# Patient Record
Sex: Male | Born: 1990 | Race: White | Hispanic: No | Marital: Single | Smoking: Never smoker
Health system: Southern US, Community
[De-identification: ages and names within clinical notes are randomized; demographics above are authoritative.]

---
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
## 2012-02-06 ENCOUNTER — Emergency Department: Payer: Self-pay | Admitting: Family Medicine

## 2020-06-24 ENCOUNTER — Other Ambulatory Visit: Payer: Self-pay

## 2020-06-24 ENCOUNTER — Encounter: Payer: Self-pay | Admitting: Emergency Medicine

## 2020-06-24 ENCOUNTER — Emergency Department
Admission: EM | Admit: 2020-06-24 | Discharge: 2020-06-24 | Disposition: A | Discharge status: Home / Self Care | Payer: BC Managed Care – PPO | Attending: Emergency Medicine | Admitting: Emergency Medicine

## 2020-06-24 DIAGNOSIS — J181 Lobar pneumonia, unspecified organism: Secondary | ICD-10-CM | POA: Diagnosis not present

## 2020-06-24 DIAGNOSIS — Z20822 Contact with and (suspected) exposure to covid-19: Secondary | ICD-10-CM | POA: Insufficient documentation

## 2020-06-24 DIAGNOSIS — R0602 Shortness of breath: Secondary | ICD-10-CM | POA: Diagnosis present

## 2020-06-24 DIAGNOSIS — J189 Pneumonia, unspecified organism: Secondary | ICD-10-CM

## 2020-06-24 DIAGNOSIS — J111 Influenza due to unidentified influenza virus with other respiratory manifestations: Secondary | ICD-10-CM

## 2020-06-24 LAB — SARS CORONAVIRUS 2 (TAT 6-24 HRS): SARS Coronavirus 2: NEGATIVE

## 2020-06-24 MED ORDER — ONDANSETRON 8 MG PO TBDP
8.0000 mg | ORAL_TABLET | Freq: Once | ORAL | Status: AC
  Administered 2020-06-24: 8 mg via ORAL
  Filled 2020-06-24: qty 1

## 2020-06-24 MED ORDER — ONDANSETRON 4 MG PO TBDP
4.0000 mg | ORAL_TABLET | Freq: Three times a day (TID) | ORAL | 0 refills | Status: AC | PRN
Start: 2020-06-24 — End: ?

## 2020-06-24 MED ORDER — AZITHROMYCIN 250 MG PO TABS: ORAL_TABLET | ORAL | 0 refills | Status: DC

## 2020-06-24 MED ORDER — NAPROXEN 500 MG PO TABS
500.0000 mg | ORAL_TABLET | Freq: Once | ORAL | Status: AC
  Administered 2020-06-24: 500 mg via ORAL
  Filled 2020-06-24: qty 1

## 2020-06-24 MED ORDER — NAPROXEN 500 MG PO TABS: 500.0000 mg | ORAL_TABLET | Freq: Two times a day (BID) | ORAL | 0 refills | Status: AC

## 2020-06-24 NOTE — ED Notes (Signed)
ED Provider at bedside. 

## 2020-06-24 NOTE — ED Triage Notes (Addendum)
First Nurse NOte:  Sent from [facility] for ED evaluation.  Patient c/o SOB and Headaches.  Per AVS from urgent care, patient has a left lower lobe pneumonia.  AAOx3.  Skin warm and dry. NAD

## 2020-06-24 NOTE — ED Provider Notes (Signed)
[facility] Emergency Department Provider Note  ____________________________________________  Time seen: Approximately 2:06 PM  I have reviewed the triage vital signs and the nursing notes.   HISTORY  Chief Complaint Shortness of Breath    HPI Kenneth Hodge is a 30 y.o. male with no significant past medical history who is sent to the ED from [facility] due to shortness of breath.  Patient reports that he has had multiple symptoms for the past 5 days including fatigue, generalized body aches, dyspnea on exertion, bilateral frontal headache, sore throat, shortness of breath, occasional nonproductive cough.  Symptoms have been persistent, waxing and waning, no aggravating or alleviating factors.  He went to [facility] today where a rapid COVID was negative, rapid flu was negative, chest x-ray showed left lower lobe infiltrate.  They sent him to the ED for further evaluation.   Patient reports that he is currently feeling better, headache has significantly improved.  Shortness of breath is improved.  Denies chest pain     History reviewed. No pertinent past medical history.   There are no problems to display for this patient.    History reviewed. No pertinent surgical history.   Prior to Admission medications   Medication Sig Start Date End Date Taking? Authorizing Provider  azithromycin (ZITHROMAX Z-PAK) 250 MG tablet Take 2 tablets (500 mg) on  Day 1,  followed by 1 tablet (250 mg) once daily on Days 2 through 5. 06/24/20  Yes Sharman Cheek, MD  naproxen (NAPROSYN) 500 MG tablet Take 1 tablet (500 mg total) by mouth 2 (two) times daily with a meal. 06/24/20  Yes Sharman Cheek, MD  ondansetron (ZOFRAN ODT) 4 MG disintegrating tablet Take 1 tablet (4 mg total) by mouth every 8 (eight) hours as needed for nausea or vomiting. 06/24/20  Yes Sharman Cheek, MD     Allergies Patient has no known allergies.   History reviewed. No pertinent family  history.  Social History Social History   Tobacco Use  . Smoking status: Never Smoker  . Smokeless tobacco: Never Used  Substance Use Topics  . Alcohol use: Yes  . Drug use: Not Currently    Review of Systems  Constitutional:   No fever positive chills.  ENT:   No sore throat. No rhinorrhea. Cardiovascular:   No chest pain or syncope. Respiratory: Positive shortness of breath and cough. Gastrointestinal:   Negative for abdominal pain, vomiting and diarrhea.  Musculoskeletal:   Negative for focal pain or swelling All other systems reviewed and are negative except as documented above in ROS and HPI.  ____________________________________________   PHYSICAL EXAM:  VITAL SIGNS: ED Triage Vitals  Enc Vitals Group     BP 06/24/20 1224 130/71     Pulse Rate 06/24/20 1224 (!) 107     Resp 06/24/20 1257 (!) 22     Temp 06/24/20 1224 98.6 F (37 C)     Temp Source 06/24/20 1224 Oral     SpO2 06/24/20 1224 97 %     Weight 06/24/20 1239 (!) 375 lb (170.1 kg)     Height 06/24/20 1239 6\' 2"  (1.88 m)     Head Circumference --      Peak Flow --      Pain Score 06/24/20 1239 7     Pain Loc --      Pain Edu? --      Excl. in GC? --     Vital signs reviewed, nursing assessments reviewed.  Constitutional:   Alert and oriented. Non-toxic appearance. Eyes:   Conjunctivae are normal. EOMI. PERRL. ENT      Head:   Normocephalic and atraumatic.      Nose:   Wearing a mask.      Mouth/Throat:   Wearing a mask.      Neck:   No meningismus. Full ROM. Hematological/Lymphatic/Immunilogical:   No cervical lymphadenopathy. Cardiovascular:   RRR. Symmetric bilateral radial and DP pulses.  No murmurs. Cap refill less than 2 seconds. Respiratory:   Normal respiratory effort without tachypnea/retractions. Breath sounds are clear and equal bilaterally. No wheezes/rales/rhonchi.  No shortness of breath or worsening symptoms with ambulation test in treatment room.  Lowest pulse ox with  ambulation is 96% on room air. Gastrointestinal:   Soft and nontender. Non distended. There is no CVA tenderness.  No rebound, rigidity, or guarding. Musculoskeletal:   Normal range of motion in all extremities. No joint effusions.  No lower extremity tenderness.  No edema. Neurologic:   Normal speech and language.  Motor grossly intact. No acute focal neurologic deficits are appreciated.  Skin:    Skin is warm, dry and intact. No rash noted.  No petechiae, purpura, or bullae.  ____________________________________________    LABS (pertinent positives/negatives) (all labs ordered are listed, but only abnormal results are displayed) Labs Reviewed  SARS CORONAVIRUS 2 (TAT 6-24 HRS)   ____________________________________________   EKG  Interpreted by me Sinus tachycardia rate 102.  Normal axis and intervals.  Normal QRS ST segments and T waves.  ____________________________________________    RADIOLOGY  No results found.  ____________________________________________   PROCEDURES Procedures  ____________________________________________    CLINICAL IMPRESSION / ASSESSMENT AND PLAN / ED COURSE  Medications ordered in the ED: Medications  ondansetron (ZOFRAN-ODT) disintegrating tablet 8 mg (8 mg Oral Given 06/24/20 1343)  naproxen (NAPROSYN) tablet 500 mg (500 mg Oral Given 06/24/20 1343)    Pertinent labs & imaging results that were available during my care of the patient were reviewed by me and considered in my medical decision making (see chart for details).  Kenneth Hodge was evaluated in Emergency Department on 06/24/2020 for the symptoms described in the history of present illness. He was evaluated in the context of the global COVID-19 pandemic, which necessitated consideration that the patient might be at risk for infection with the SARS-CoV-2 virus that causes COVID-19. Institutional protocols and algorithms that pertain to the evaluation of patients at risk for  COVID-19 are in a state of rapid change based on information released by regulatory bodies including the CDC and federal and state organizations. These policies and algorithms were followed during the patient's care in the ED.   Patient sent to the ED for evaluation of shortness of breath and tachycardia, chest x-ray performed at urgent care showing left lower lobe infiltrate.  Symptoms are very consistent with influenza-like illness/COVID-19.  His rapid antigen test at urgent care were negative, so we will send a PCR.  I will start him on azithromycin for the infiltrate although I suspect it is due to COVID.  He is not septic.   Considering the patient's symptoms, medical history, and physical examination today, I have low suspicion for ACS, PE, TAD, pneumothorax, carditis, mediastinitis, pneumonia, CHF, or sepsis.  Stable for discharge home with supportive care, naproxen and Zofran.      ____________________________________________   FINAL CLINICAL IMPRESSION(S) / ED DIAGNOSES    Final diagnoses:  Influenza-like illness  Community acquired pneumonia of left lower  lobe of lung     ED Discharge Orders         Ordered    naproxen (NAPROSYN) 500 MG tablet  2 times daily with meals        06/24/20 1406    ondansetron (ZOFRAN ODT) 4 MG disintegrating tablet  Every 8 hours PRN        06/24/20 1406    azithromycin (ZITHROMAX Z-PAK) 250 MG tablet        06/24/20 1407          Portions of this note were generated with dragon dictation software. Dictation errors may occur despite best attempts at proofreading.   Sharman Cheek, MD 06/24/20 1410

## 2020-06-24 NOTE — ED Triage Notes (Signed)
Pt comes into the ED via POV sent by [facility] for [facility].  Pt was seen at [facility] and they sent him over for possible sepsis.  Pt had a negative COVID and flu test.  PT did complete a Chest xray at that time as well which shows possible pneumonia.  Pt currently has even and unlabored respirations.  Pt in NAD.  Pt ambulatory to triage. Pt states he feels better after the tylenol and breathing treatment they gave him at [facility].  Denies any dizziness or CP.

## 2020-09-27 ENCOUNTER — Encounter (HOSPITAL_COMMUNITY): Payer: Self-pay

## 2020-09-27 ENCOUNTER — Ambulatory Visit (HOSPITAL_COMMUNITY)
Admission: EM | Admit: 2020-09-27 | Discharge: 2020-09-27 | Disposition: A | Discharge status: Home / Self Care | Payer: Managed Care, Other (non HMO) | Attending: Medical Oncology | Admitting: Medical Oncology

## 2020-09-27 DIAGNOSIS — R109 Unspecified abdominal pain: Secondary | ICD-10-CM

## 2020-09-27 LAB — POCT URINALYSIS DIPSTICK, ED / UC
Bilirubin Urine: NEGATIVE
Glucose, UA: NEGATIVE mg/dL
Hgb urine dipstick: NEGATIVE
Leukocytes,Ua: NEGATIVE
Nitrite: NEGATIVE
Protein, ur: NEGATIVE mg/dL
Specific Gravity, Urine: >=1.03 (ref 1.005–1.030)
Urobilinogen, UA: 0.2 mg/dL (ref 0.0–1.0)
pH: 5.5 (ref 5.0–8.0)

## 2020-09-27 MED ORDER — VALACYCLOVIR HCL 1 G PO TABS: 1000.0000 mg | ORAL_TABLET | Freq: Three times a day (TID) | ORAL | 0 refills | Status: AC

## 2020-09-27 NOTE — ED Triage Notes (Signed)
Pt presents with left side flank pain since yesterday with no other complaints.

## 2020-09-27 NOTE — ED Provider Notes (Addendum)
[facility]    CSN: [id number] Arrival date & time: 09/27/20  1403      History   Chief Complaint Chief Complaint  Patient presents with  . Flank Pain    HPI Kenneth Hodge is a 30 y.o. male.   HPI   Flank Pain: Pt reports left sided flank pain for the past 2 days.  He does report that he has off-and-on flank pain that he has had for quite some time but the episodes only last a few seconds and then resolve on their own.  This has been more constant in nature.  He denies any known rash has had mild itching of the left side compared to the right side. Also feels burning and tingling in area which is tender to touch. He denies any vomiting, stool changes, urinary frequency or hematuria but he does note that his urine has looked darker and has an odor since about the time that he had his Covid vaccine a few months ago.  He has tried staying hydrated with water for symptoms without any significant improvement.   History reviewed. No pertinent past medical history.  There are no problems to display for this patient.   History reviewed. No pertinent surgical history.     Home Medications    Prior to Admission medications   Medication Sig Start Date End Date Taking? Authorizing Provider  valACYclovir (VALTREX) 1000 MG tablet Take 1 tablet (1,000 mg total) by mouth 3 (three) times daily for 7 days. 09/27/20 10/04/20 Yes Letanya Froh M, PA-C  naproxen (NAPROSYN) 500 MG tablet Take 1 tablet (500 mg total) by mouth 2 (two) times daily with a meal. 06/24/20   Sharman Cheek, MD  ondansetron (ZOFRAN ODT) 4 MG disintegrating tablet Take 1 tablet (4 mg total) by mouth every 8 (eight) hours as needed for nausea or vomiting. 06/24/20   Sharman Cheek, MD    Family History Family History  Family history unknown: Yes    Social History Social History   Tobacco Use  . Smoking status: Never Smoker  . Smokeless tobacco: Never Used  Substance Use Topics  . Alcohol  use: Yes  . Drug use: Not Currently     Allergies   Patient has no known allergies.   Review of Systems Review of Systems   Physical Exam Triage Vital Signs ED Triage Vitals  Enc Vitals Group     BP 09/27/20 1453 134/80     Pulse Rate 09/27/20 1453 75     Resp 09/27/20 1453 20     Temp 09/27/20 1453 98.6 F (37 C)     Temp Source 09/27/20 1453 Oral     SpO2 09/27/20 1453 96 %     Weight --      Height --      Head Circumference --      Peak Flow --      Pain Score 09/27/20 1451 8     Pain Loc --      Pain Edu? --      Excl. in GC? --    No data found.  Updated Vital Signs BP 134/80 (BP Location: Right Arm)   Pulse 75   Temp 98.6 F (37 C) (Oral)   Resp 20   SpO2 96%    Physical Exam Vitals and nursing note reviewed.  Constitutional:      General: He is not in acute distress.    Appearance: Normal appearance. He is obese. He is  not ill-appearing, toxic-appearing or diaphoretic.  Cardiovascular:     Rate and Rhythm: Normal rate and regular rhythm.     Heart sounds: Normal heart sounds.  Pulmonary:     Effort: Pulmonary effort is normal.     Breath sounds: Normal breath sounds.  Abdominal:     General: Abdomen is flat. Bowel sounds are normal. There is no distension.     Palpations: Abdomen is soft. There is no mass.     Tenderness: There is no abdominal tenderness. There is no right CVA tenderness, left CVA tenderness, guarding or rebound.     Hernia: No hernia is present.  Musculoskeletal:     Cervical back: Normal range of motion and neck supple.     Comments: ROM back and neck intact. No midline tenderness of spine  Skin:    General: Skin is warm.     Findings: Rash (one small possibly vessicular lesion of the left flank with excoriations of the left side) present.  Neurological:     Mental Status: He is alert.      UC Treatments / Results  Labs (all labs ordered are listed, but only abnormal results are displayed) Labs Reviewed  POCT  URINALYSIS DIPSTICK, ED / UC - Abnormal; Notable for the following components:      Result Value   Ketones, ur TRACE (*)    All other components within normal limits  URINE CULTURE    EKG   Radiology No results found.  Procedures Procedures (including critical care time)  Medications Ordered in UC Medications - No data to display  Initial Impression / Assessment and Plan / UC Course  I have reviewed the triage vital signs and the nursing notes.  Pertinent labs & imaging results that were available during my care of the patient were reviewed by me and considered in my medical decision making (see chart for details).     New. UA shows trace ketones and elevated specific gravity indicating some mild dehydration. Urine culture pending. No evidence of blood or sign of kidney stone. Does not appear to be MSK but possible. Will treat for potential early shingles with Valtrex given rash and history along with urine findings. Discussed reds flag signs and symptoms.    Final Clinical Impressions(s) / UC Diagnoses   Final diagnoses:  Flank pain   Discharge Instructions   None    ED Prescriptions    Medication Sig Dispense Auth. Provider   valACYclovir (VALTREX) 1000 MG tablet Take 1 tablet (1,000 mg total) by mouth 3 (three) times daily for 7 days. 21 tablet Rushie Chestnut, [place]     PDMP not reviewed this encounter.   Rushie Chestnut, PA-C 09/27/20 1551    Rushie Chestnut, PA-C 09/27/20 [phone]

## 2020-09-28 ENCOUNTER — Emergency Department (HOSPITAL_COMMUNITY)
Admission: EM | Admit: 2020-09-28 | Discharge: 2020-09-29 | Disposition: A | Discharge status: Home / Self Care | Payer: Managed Care, Other (non HMO) | Attending: Emergency Medicine | Admitting: Emergency Medicine

## 2020-09-28 ENCOUNTER — Encounter (HOSPITAL_COMMUNITY): Payer: Self-pay | Admitting: Emergency Medicine

## 2020-09-28 ENCOUNTER — Other Ambulatory Visit: Payer: Self-pay

## 2020-09-28 ENCOUNTER — Emergency Department (HOSPITAL_COMMUNITY): Payer: Managed Care, Other (non HMO)

## 2020-09-28 DIAGNOSIS — R091 Pleurisy: Secondary | ICD-10-CM | POA: Insufficient documentation

## 2020-09-28 DIAGNOSIS — R0781 Pleurodynia: Secondary | ICD-10-CM

## 2020-09-28 DIAGNOSIS — R109 Unspecified abdominal pain: Secondary | ICD-10-CM | POA: Diagnosis present

## 2020-09-28 DIAGNOSIS — R0602 Shortness of breath: Secondary | ICD-10-CM | POA: Diagnosis not present

## 2020-09-28 LAB — CBC WITH DIFFERENTIAL/PLATELET
Abs Immature Granulocytes: 0.03 10*3/uL (ref 0.00–0.07)
Basophils Absolute: 0 10*3/uL (ref 0.0–0.1)
Basophils Relative: 0 %
Eosinophils Absolute: 0.3 10*3/uL (ref 0.0–0.5)
Eosinophils Relative: 3 %
HCT: 47.9 % (ref 39.0–52.0)
Hemoglobin: 15.5 g/dL (ref 13.0–17.0)
Immature Granulocytes: 0 %
Lymphocytes Relative: 34 %
Lymphs Abs: 3.6 10*3/uL (ref 0.7–4.0)
MCH: 28.2 pg (ref 26.0–34.0)
MCHC: 32.4 g/dL (ref 30.0–36.0)
MCV: 87.1 fL (ref 80.0–100.0)
Monocytes Absolute: 0.8 10*3/uL (ref 0.1–1.0)
Monocytes Relative: 8 %
Neutro Abs: 5.9 10*3/uL (ref 1.7–7.7)
Neutrophils Relative %: 55 %
Platelets: 321 10*3/uL (ref 150–400)
RBC: 5.5 MIL/uL (ref 4.22–5.81)
RDW: 12.2 % (ref 11.5–15.5)
WBC: 10.6 10*3/uL — ABNORMAL HIGH (ref 4.0–10.5)
nRBC: 0 % (ref 0.0–0.2)

## 2020-09-28 LAB — COMPREHENSIVE METABOLIC PANEL
ALT: 37 U/L (ref 0–44)
AST: 27 U/L (ref 15–41)
Albumin: 4 g/dL (ref 3.5–5.0)
Alkaline Phosphatase: 77 U/L (ref 38–126)
Anion gap: 8 (ref 5–15)
BUN: 13 mg/dL (ref 6–20)
CO2: 27 mmol/L (ref 22–32)
Calcium: 9.1 mg/dL (ref 8.9–10.3)
Chloride: 103 mmol/L (ref 98–111)
Creatinine, Ser: 1.32 mg/dL — ABNORMAL HIGH (ref 0.61–1.24)
GFR, Estimated: >60 mL/min (ref >60)
Glucose, Bld: 95 mg/dL (ref 70–99)
Potassium: 3.8 mmol/L (ref 3.5–5.1)
Sodium: 138 mmol/L (ref 135–145)
Total Bilirubin: 0.6 mg/dL (ref 0.3–1.2)
Total Protein: 7.3 g/dL (ref 6.5–8.1)

## 2020-09-28 LAB — TROPONIN I (HIGH SENSITIVITY): Troponin I (High Sensitivity): 4 ng/L (ref <18)

## 2020-09-28 LAB — URINALYSIS, ROUTINE W REFLEX MICROSCOPIC
Bilirubin Urine: NEGATIVE
Glucose, UA: NEGATIVE mg/dL
Hgb urine dipstick: NEGATIVE
Ketones, ur: NEGATIVE mg/dL
Leukocytes,Ua: NEGATIVE
Nitrite: NEGATIVE
Protein, ur: NEGATIVE mg/dL
Specific Gravity, Urine: 1.025 (ref 1.005–1.030)
pH: 7 (ref 5.0–8.0)

## 2020-09-28 LAB — URINE CULTURE: Culture: NO GROWTH

## 2020-09-28 LAB — LIPASE, BLOOD: Lipase: 44 U/L (ref 11–51)

## 2020-09-28 NOTE — ED Triage Notes (Signed)
Pt c/o left flank pain that radiates to his back. Pain worse with movement. Reports that it is hard to breathe when pain is severe. Pt seen at [facility] yesterday for same, diagnosed with shingles and started on antivirals.

## 2020-09-28 NOTE — ED Triage Notes (Signed)
Emergency Medicine Provider Triage Evaluation Note  Kenneth Hodge , a 30 y.o. male  was evaluated in triage.  Pt complains of luq left chest and left flank pain that started a few days ago. Reports sob and mild cough as well. Denies nvd, urinary sxs, fever, hemoptysis.   Denies leg pain/swelling, hemoptysis, recent surgery/trauma, recent long travel, hormone use, personal hx of cancer, or hx of DVT/PE. Marland Kitchen  Seen at urgent care yesterday and dx with possible shingles. Started on antivirals with no improvement.   Review of Systems  Positive: Left chest, abd, flank pain, sob, cough Negative: Fever, hemoptysis, nvd, fever  Physical Exam  BP 121/73 (BP Location: Right Arm)   Pulse 68   Temp 98.2 F (36.8 C)   Resp 20   SpO2 98%  Gen:   Awake, no distress   HEENT:  Atraumatic  Resp:  Normal effort  Cardiac:  Normal rate, no chest wall ttp Abd:   Nondistended, nontender  MSK:   Moves extremities without difficulty  Neuro:  Speech clear  Skin: no obvious lesions to the skin and no ttp to the skin  Medical Decision Making  Medically screening exam initiated at 9:54 PM.  Appropriate orders placed.  Kenneth Hodge was informed that the remainder of the evaluation will be completed by another provider, this initial triage assessment does not replace that evaluation, and the importance of remaining in the ED until their evaluation is complete.  Clinical Impression   30 y/o male here for left flank, abd and left chest  Pain  MSE was initiated and I personally evaluated the patient and placed orders (if any) at  9:54 PM on September 28, 2020.  The patient appears stable so that the remainder of the MSE may be completed by another provider.    Karrie Meres, [place] 09/28/20 2154

## 2020-09-29 ENCOUNTER — Emergency Department (HOSPITAL_COMMUNITY): Payer: Managed Care, Other (non HMO)

## 2020-09-29 LAB — TROPONIN I (HIGH SENSITIVITY): Troponin I (High Sensitivity): 3 ng/L (ref <18)

## 2020-09-29 LAB — D-DIMER, QUANTITATIVE: D-Dimer, Quant: 0.87 ug/mL-FEU — ABNORMAL HIGH (ref 0.00–0.50)

## 2020-09-29 MED ORDER — IOHEXOL 350 MG/ML SOLN
75.0000 mL | Freq: Once | INTRAVENOUS | Status: AC | PRN
  Administered 2020-09-29: 75 mL via INTRAVENOUS

## 2020-09-29 NOTE — ED Notes (Signed)
Patient transported back from CT 

## 2020-09-29 NOTE — Discharge Instructions (Signed)
You were seen in the ED today with abdominal and chest pain. Your CT scans shows some loss of blood flow to an area of fatty tissue in the abdomen. This is not dangerous but is painful. Continue Tylenol and/or Motrin as needed for pain. You can apply a warm compress. The pain may last for several more days but should ultimately resolve.

## 2020-09-29 NOTE — ED Notes (Signed)
Patient transported to CT via stretcher in stable condition 

## 2020-09-29 NOTE — ED Provider Notes (Signed)
Emergency Department Provider Note   I have reviewed the triage vital signs and the nursing notes.   HISTORY  Chief Complaint Flank Pain   HPI Kenneth Hodge is a 30 y.o. male presents to the emergency department for evaluation of left flank and lower chest pain.  Symptoms of been ongoing for the past several days.  He initially went to urgent care and was diagnosed with possible developing shingles type pain/rash.  He was started on antivirals.  He states that no additional rash has developed and he is concerned for possible kidney stone.  He is also noting some left lower chest pain which is causing him to feel short of breath.  Pain is worse with coughing or deep breathing.  He is not having gross blood in the urine, dysuria, hesitancy, urgency.  No fevers or chills.  No hemoptysis. No prior history of DVT/PE.  History reviewed. No pertinent past medical history.  There are no problems to display for this patient.   History reviewed. No pertinent surgical history.  Allergies Patient has no known allergies.  Family History  Family history unknown: Yes    Social History Social History   Tobacco Use  . Smoking status: Never Smoker  . Smokeless tobacco: Never Used  Substance Use Topics  . Alcohol use: Yes  . Drug use: Not Currently    Review of Systems  Constitutional: No fever/chills Eyes: No visual changes. ENT: No sore throat. Cardiovascular: Positive chest pain. Respiratory: Positive shortness of breath. Gastrointestinal: Positive left flank/abdominal pain. Mild nausea, no vomiting.  No diarrhea.  No constipation. Genitourinary: Negative for dysuria. Musculoskeletal: Negative for back pain. Skin: Negative for rash. Neurological: Negative for headaches, focal weakness or numbness.  10-point ROS otherwise negative.  ____________________________________________   PHYSICAL EXAM:  VITAL SIGNS: ED Triage Vitals  Enc Vitals Group     BP 09/28/20  2036 121/73     Pulse Rate 09/28/20 2036 68     Resp 09/28/20 2036 20     Temp 09/28/20 2036 98.2 F (36.8 C)     Temp src --      SpO2 09/28/20 2036 98 %   Constitutional: Alert and oriented. Well appearing and in no acute distress. Eyes: Conjunctivae are normal. Head: Atraumatic. Nose: No congestion/rhinnorhea. Mouth/Throat: Mucous membranes are moist.  Neck: No stridor.   Cardiovascular: Normal rate, regular rhythm. Good peripheral circulation. Grossly normal heart sounds.   Respiratory: Normal respiratory effort.  No retractions. Lungs CTAB. Gastrointestinal: Soft and nontender. No distention.  Musculoskeletal: No lower extremity tenderness nor edema. No gross deformities of extremities. Neurologic:  Normal speech and language. No gross focal neurologic deficits are appreciated.  Skin:  Skin is warm, dry and intact. No rash noted.  ____________________________________________   LABS (all labs ordered are listed, but only abnormal results are displayed)  Labs Reviewed  COMPREHENSIVE METABOLIC PANEL - Abnormal; Notable for the following components:      Result Value   Creatinine, Ser 1.32 (*)    All other components within normal limits  CBC WITH DIFFERENTIAL/PLATELET - Abnormal; Notable for the following components:   WBC 10.6 (*)    All other components within normal limits  URINALYSIS, ROUTINE W REFLEX MICROSCOPIC - Abnormal; Notable for the following components:   APPearance CLOUDY (*)    All other components within normal limits  D-DIMER, QUANTITATIVE - Abnormal; Notable for the following components:   D-Dimer, Quant 0.87 (*)    All other components within normal  limits  LIPASE, BLOOD  TROPONIN I (HIGH SENSITIVITY)  TROPONIN I (HIGH SENSITIVITY)   ____________________________________________  EKG   EKG Interpretation  Date/Time:  Monday September 28 2020 21:29:35 EDT Ventricular Rate:  81 PR Interval:  136 QRS Duration: 98 QT Interval:  364 QTC  Calculation: 422 R Axis:   38 Text Interpretation: Normal sinus rhythm Normal ECG Confirmed by Alona Bene [id number]) on 09/28/2020 11:52:48 PM       ____________________________________________  RADIOLOGY  DG Chest 2 View  Result Date: 09/28/2020 CLINICAL DATA:  Chest pain EXAM: CHEST - 2 VIEW COMPARISON:  11/07/2009 FINDINGS: Bibasilar atelectasis. Heart is normal size. No effusions. No acute bony abnormality. IMPRESSION: Bibasilar atelectasis. Electronically Signed   By: Charlett Nose M.D.   On: 09/28/2020 21:45   CT Angio Chest PE W and/or Wo Contrast  Result Date: 09/29/2020 CLINICAL DATA:  Left flank pain radiating to the back, worse with movement. Recent diagnosis of shingles EXAM: CT ANGIOGRAPHY CHEST WITH CONTRAST TECHNIQUE: Multidetector CT imaging of the chest was performed using the standard protocol during bolus administration of intravenous contrast. Multiplanar CT image reconstructions and MIPs were obtained to evaluate the vascular anatomy. CONTRAST:  55mL OMNIPAQUE IOHEXOL 350 MG/ML SOLN COMPARISON:  None. FINDINGS: Cardiovascular: Satisfactory opacification of the pulmonary arteries to the segmental level. No evidence of pulmonary embolism. Borderline heart size. No pericardial effusion. Mediastinum/Nodes: Negative for adenopathy or mass. Lungs/Pleura: Bilateral atelectasis. There is no edema, consolidation, effusion, or pneumothorax. Upper Abdomen: Reported separately Musculoskeletal: Negative Review of the MIP images confirms the above findings. IMPRESSION: 1. Negative for pulmonary embolism. 2. Low volume chest with bilateral atelectasis. Electronically Signed   By: Marnee Spring M.D.   On: 09/29/2020 04:20   CT Renal Stone Study  Result Date: 09/29/2020 CLINICAL DATA:  Left flank pain.  Recent diagnosis of shingles. EXAM: CT ABDOMEN AND PELVIS WITHOUT CONTRAST TECHNIQUE: Multidetector CT imaging of the abdomen and pelvis was performed following the standard protocol without  IV contrast. COMPARISON:  None. FINDINGS: Lower chest:  Atelectasis at the lung bases Hepatobiliary: No focal liver abnormality.No evidence of biliary obstruction or stone. Pancreas: Unremarkable. Spleen: Unremarkable. Adrenals/Urinary Tract: Negative adrenals. No hydronephrosis or stone. Unremarkable bladder. Stomach/Bowel:  No obstruction. No appendicitis. Vascular/Lymphatic: No acute vascular abnormality. No mass or adenopathy. Reproductive:No pathologic findings. Other: No ascites or pneumoperitoneum. Discrete, rounded area of fat stranding in the left upper quadrant, anterior to the spleen which is unremarkable itself. Musculoskeletal: No acute abnormalities. No skin or subcutaneous thickening detected to correlate with history of shingles, although CT is of low sensitivity for this purpose. 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 Electronically Signed   By: Marnee Spring M.D.   On: 09/29/2020 04:22    ____________________________________________   PROCEDURES  Procedure(s) performed:   Procedures  None  ____________________________________________   INITIAL IMPRESSION / ASSESSMENT AND PLAN / ED COURSE  Pertinent labs & imaging results that were available during my care of the patient were reviewed by me and considered in my medical decision making (see chart for details).   Patient presents to the emergency department for evaluation of left flank pain.  In looking at the patient's skin I do not see an obvious zoster rash.  Differential includes kidney stone although UA shows no sign of infection or blood in the urine.  Lab work from triage shows a normal troponin which is ordered with the patient's complaint of some chest discomfort  on the same side as flank symptoms.  Chest x-ray also ordered from triage which is normal.  EKG interpreted by me as above with no acute ischemic findings.  Patient's lower chest pain is somewhat  pleuritic and while MSK etiology seems most likely versus referred pain from the left flank I do plan for a D-dimer.  If negative we will move forward with a CT renal protocol. If positive, patient may ultimately require further risk stratification for PE with CTA. Patient is otherwise low risk by [place].   03:35 PM  D-dimer elevated. Will obtain CTA and CT renal.   CTA without PE or other acute process. No ureteral stone. LUQ fatty infarct noted. No other acute findings. This does correlate with area of pain. Plan for OTC pain medications and warm compress. Plan for PCP follow up. Discussed ED return precautions.  ____________________________________________  FINAL CLINICAL IMPRESSION(S) / ED DIAGNOSES  Final diagnoses:  Pleuritic chest pain  Left flank pain     MEDICATIONS GIVEN DURING THIS VISIT:  Medications  iohexol (OMNIPAQUE) 350 MG/ML injection 75 mL (75 mLs Intravenous Contrast Given 09/29/20 0357)     Note:  This document was prepared using Dragon voice recognition software and may include unintentional dictation errors.  Alona Bene, MD, [facility] Emergency Medicine    Dreden Rivere, Arlyss Repress, MD 09/29/20 [id number]

## 2021-12-15 IMAGING — CT CT ANGIO CHEST
2 of 7 series · 18 of 46 positions shown · IV contrast (APPLIED)
Comparison: None.

CLINICAL DATA: Left flank pain radiating to the back, worse with
movement. Recent diagnosis of shingles

EXAM:
CT ANGIOGRAPHY CHEST WITH CONTRAST
TECHNIQUE: Multidetector CT imaging of the chest was performed using the
standard protocol during bolus administration of intravenous
contrast. Multiplanar CT image reconstructions and MIPs were
obtained to evaluate the vascular anatomy.
CONTRAST:  75mL OMNIPAQUE IOHEXOL 350 MG/ML SOLN

[Series 7: thins · axial · 0.78mm/px · z∈[-248,-10]mm · 15 of 385 slices shown]
[im 22/385  lung]
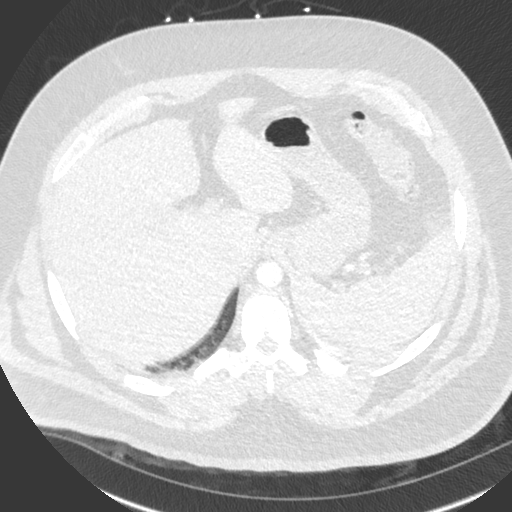
[im 43/385  soft-tissue]
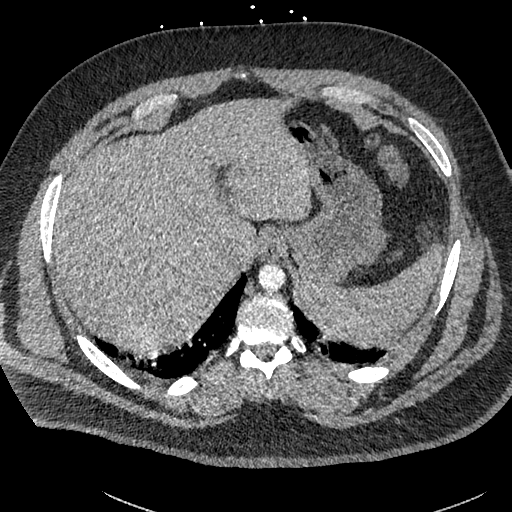
[im 65/385  lung]
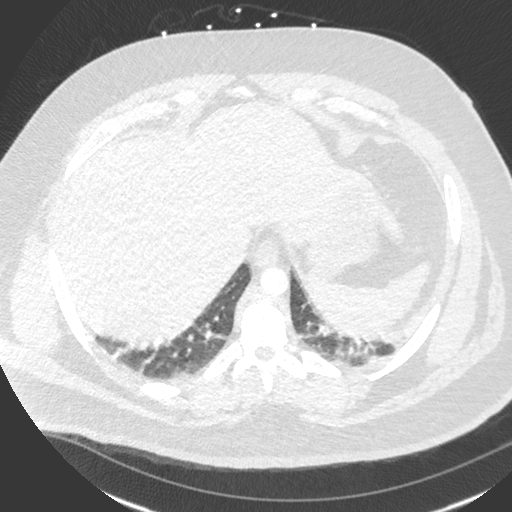
[im 86/385  soft-tissue]
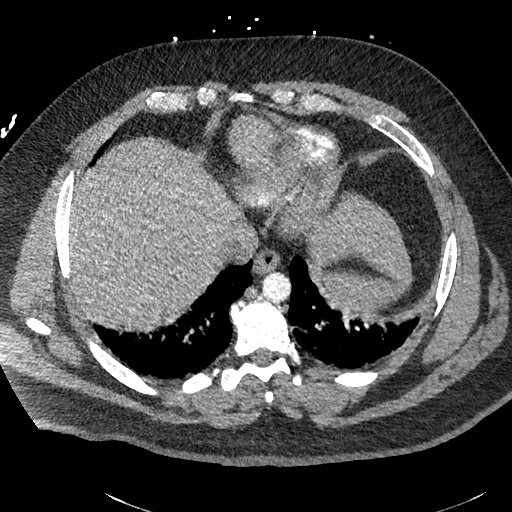
[im 129/385  lung]
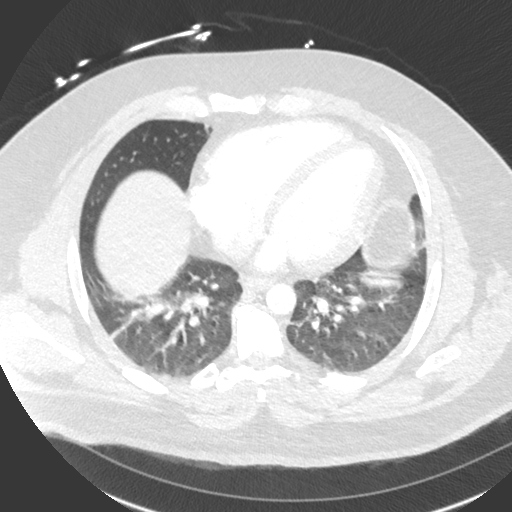
[im 150/385  soft-tissue]
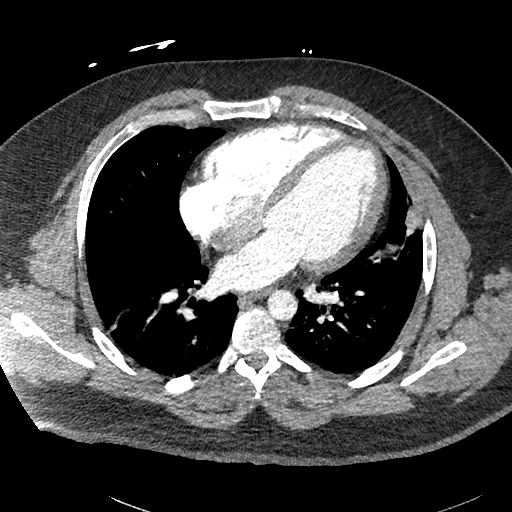
[im 171/385  lung]
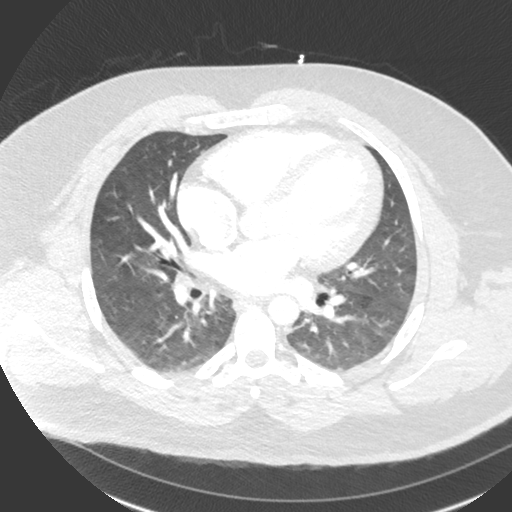
[im 193/385  soft-tissue]
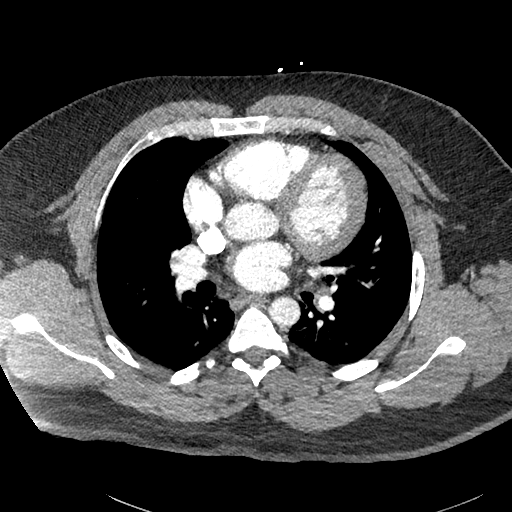
[im 214/385  lung]
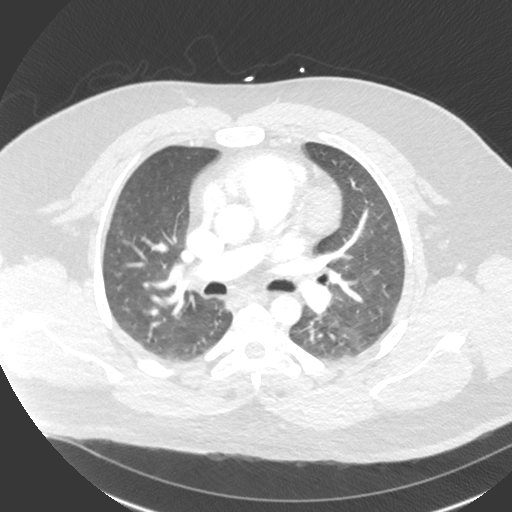
[im 235/385  soft-tissue]
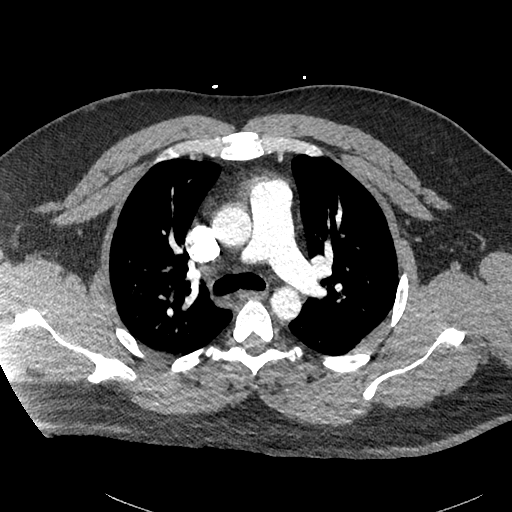
[im 257/385  lung]
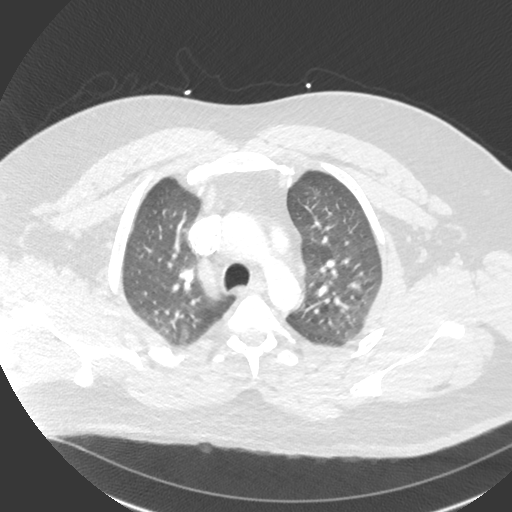
[im 299/385  soft-tissue]
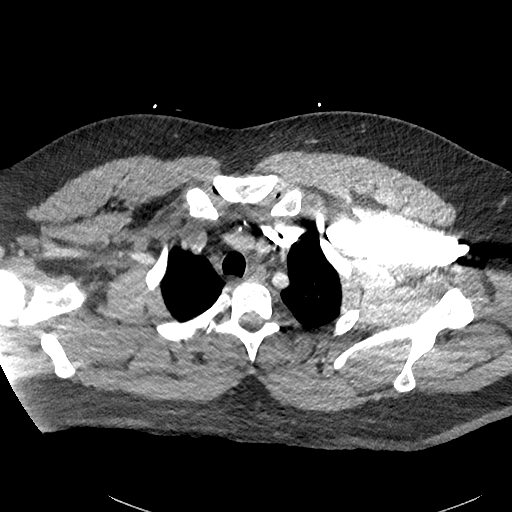
[im 321/385  lung]
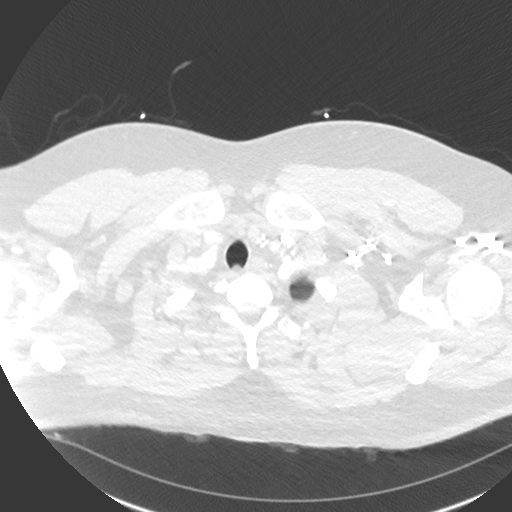
[im 342/385  soft-tissue]
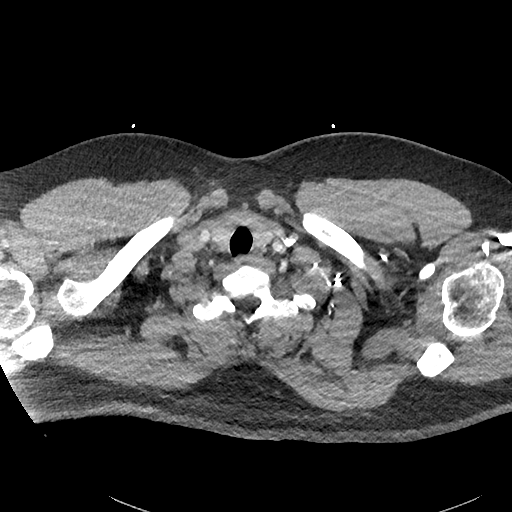
[im 363/385  lung]
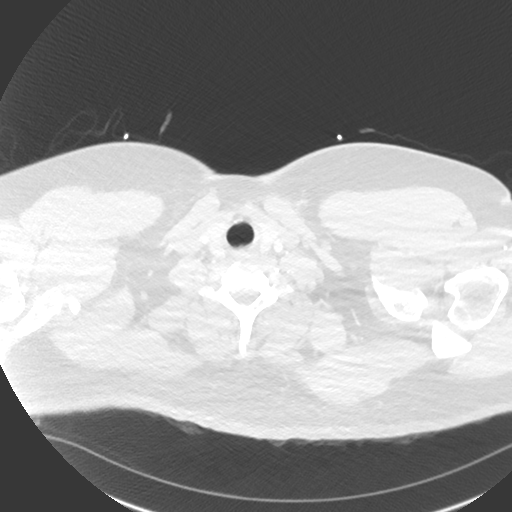

[Series 8: cor · coronal · 0.54mm/px · 3 of 138 slices shown]
[im 35/138  soft-tissue]
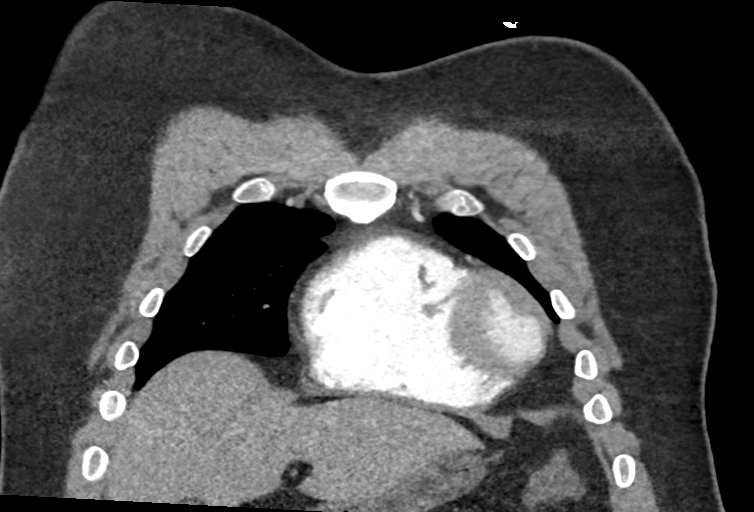
[im 69/138  soft-tissue]
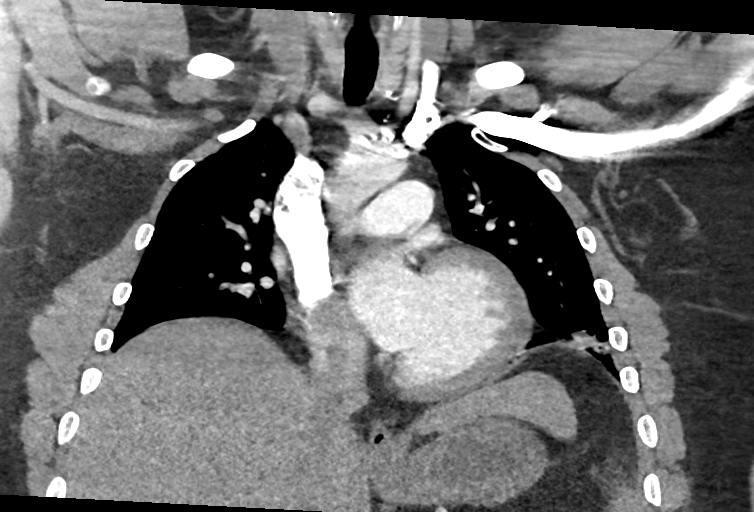
[im 103/138  soft-tissue]
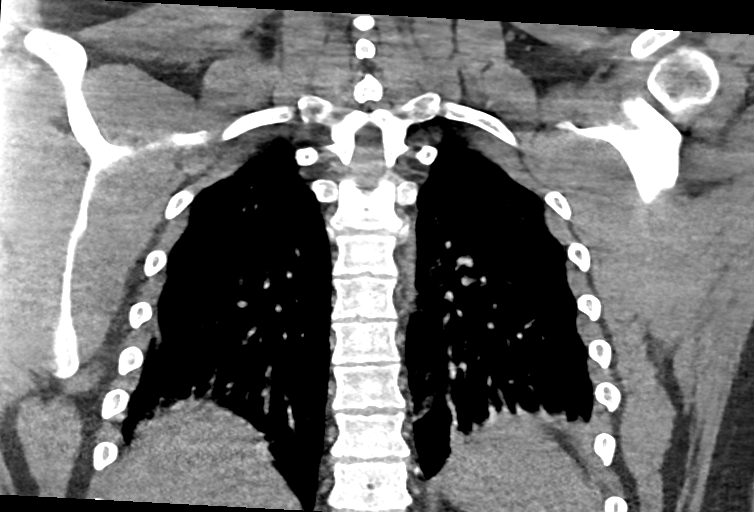

[18 of 46 positions shown; findings below may reference images not displayed]

FINDINGS: Cardiovascular: Satisfactory opacification of the pulmonary arteries
to the segmental level. No evidence of pulmonary embolism.
Borderline heart size. No pericardial effusion.

Mediastinum/Nodes: Negative for adenopathy or mass.

Lungs/Pleura: Bilateral atelectasis. There is no edema,
consolidation, effusion, or pneumothorax.

Upper Abdomen: Reported separately

Musculoskeletal: Negative

Review of the MIP images confirms the above findings.
IMPRESSION: 1. Negative for pulmonary embolism.
2. Low volume chest with bilateral atelectasis.
